# Patient Record
Sex: Female | Born: 1935 | Race: White | Hispanic: No
Health system: Southern US, Community
[De-identification: ages and names within clinical notes are randomized; demographics above are authoritative.]

---

## 2002-06-27 ENCOUNTER — Encounter: Payer: Self-pay | Admitting: Vascular Surgery

## 2002-06-29 ENCOUNTER — Ambulatory Visit (HOSPITAL_COMMUNITY): Admission: RE | Admit: 2002-06-29 | Discharge: 2002-06-29 | Payer: Self-pay | Admitting: Vascular Surgery

## 2002-07-09 ENCOUNTER — Encounter: Payer: Self-pay | Admitting: Vascular Surgery

## 2002-07-09 ENCOUNTER — Inpatient Hospital Stay (HOSPITAL_COMMUNITY): Admission: RE | Admit: 2002-07-09 | Discharge: 2002-07-17 | Payer: Self-pay | Admitting: Vascular Surgery

## 2002-07-17 ENCOUNTER — Inpatient Hospital Stay: Admission: RE | Admit: 2002-07-17 | Discharge: 2002-07-25 | Payer: Self-pay | Admitting: Vascular Surgery

## 2003-02-12 ENCOUNTER — Encounter: Payer: Self-pay | Admitting: Vascular Surgery

## 2003-02-14 ENCOUNTER — Ambulatory Visit (HOSPITAL_COMMUNITY): Admission: RE | Admit: 2003-02-14 | Discharge: 2003-02-15 | Payer: Self-pay | Admitting: Vascular Surgery

## 2005-03-15 ENCOUNTER — Encounter: Payer: Self-pay | Admitting: Internal Medicine

## 2006-04-24 ENCOUNTER — Inpatient Hospital Stay (HOSPITAL_COMMUNITY): Admission: EM | Admit: 2006-04-24 | Discharge: 2006-04-27 | Payer: Self-pay | Admitting: Emergency Medicine

## 2006-04-25 ENCOUNTER — Encounter (INDEPENDENT_AMBULATORY_CARE_PROVIDER_SITE_OTHER): Payer: Self-pay | Admitting: Specialist

## 2006-04-25 ENCOUNTER — Encounter (INDEPENDENT_AMBULATORY_CARE_PROVIDER_SITE_OTHER): Payer: Self-pay | Admitting: Interventional Cardiology

## 2007-10-18 IMAGING — CR DG CHEST 1V PORT
1 series · 1 of 1 positions shown · non-contrast
Comparison: None

CLINICAL DATA: [AGE], DVT left hand.  Preop respiratory exam.  
 PORTABLE CHEST - 04/24/2006:

[view not recorded]
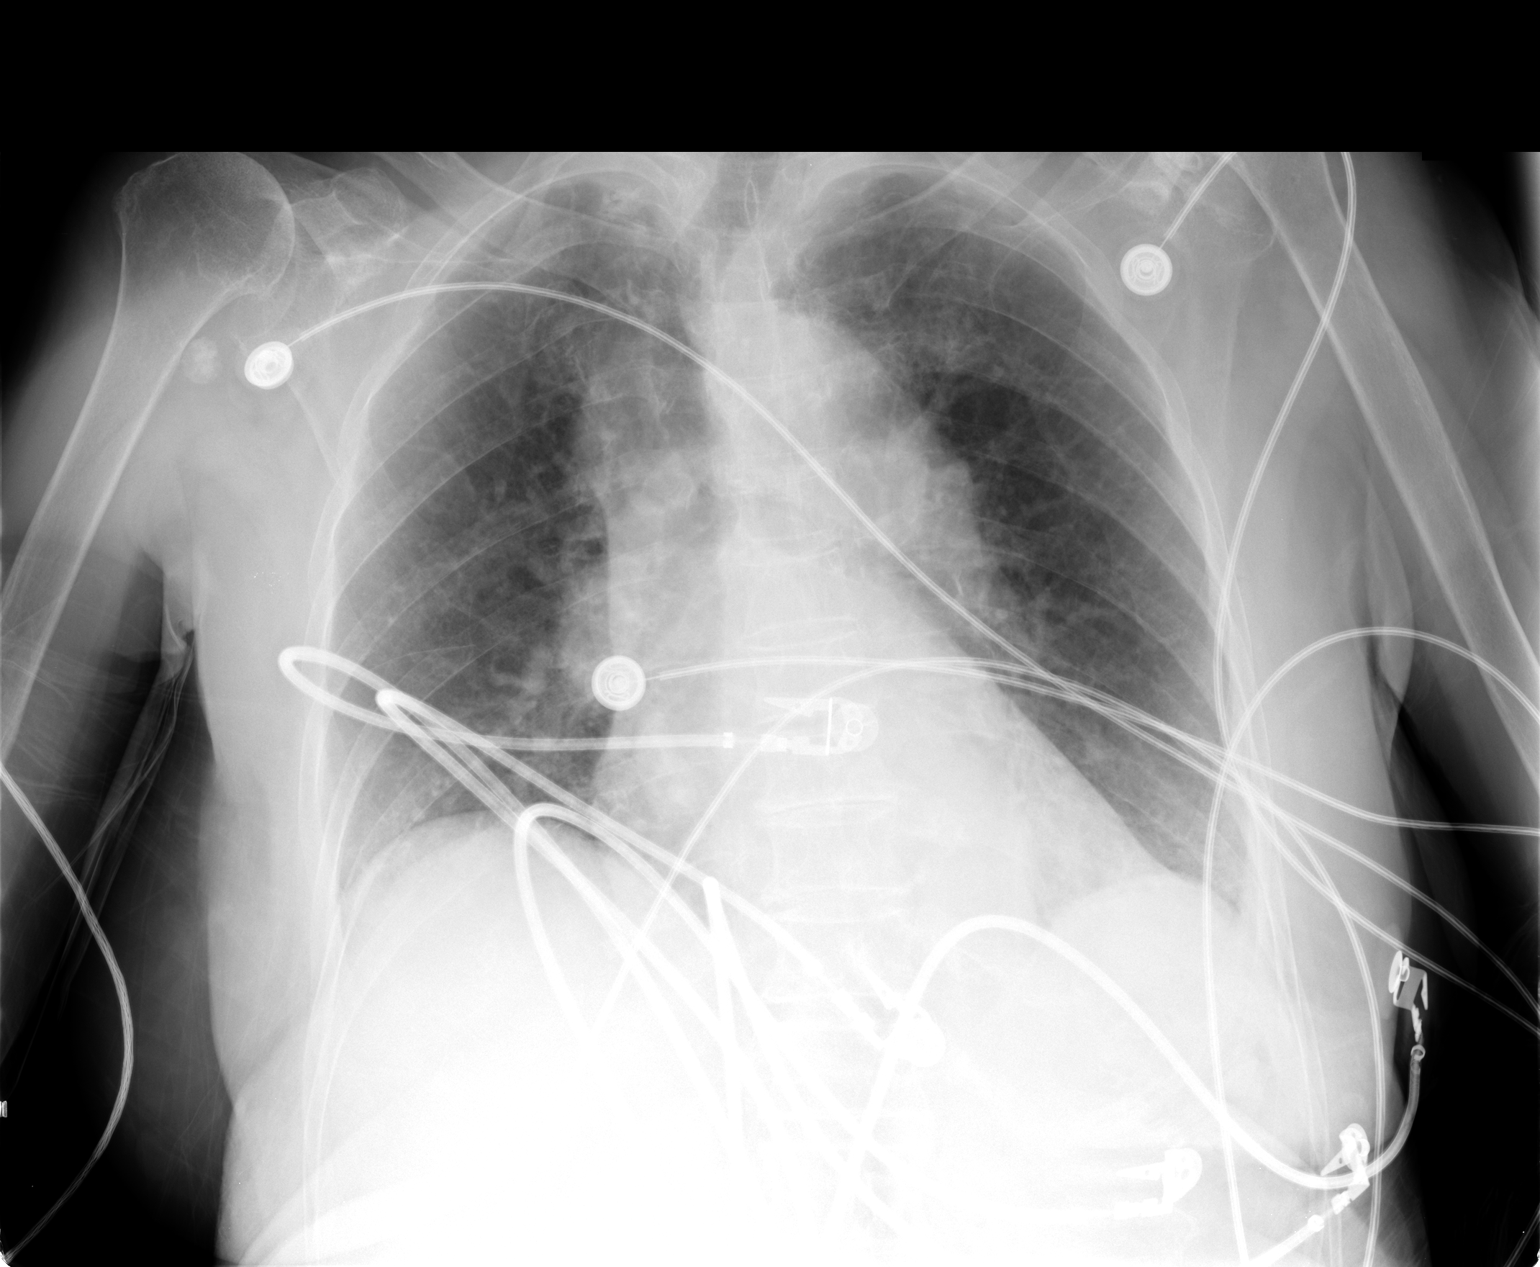

[1 of 1 positions shown; findings below may reference images not displayed]

FINDINGS: Heart is within normal limits in size.  There is tortuosity and ectasia of the thoracic aorta which is accentuated by rotation of the patient.   There is advanced degenerative change involving both shoulders.  No acute pulmonary findings.  Probable chronic interstitial changes.
IMPRESSION: No acute cardiopulmonary findings.

## 2009-11-14 DEATH — deceased

## 2010-10-02 NOTE — Op Note (Signed)
NAME:  [name], [name]                          ACCOUNT NO.:  [id number]   MEDICAL RECORD NO.:  [id number]                   PATIENT TYPE:  OIB   LOCATION:  2899                                 FACILITY:  [facility]   PHYSICIAN:  Quita Skye. Hart Rochester, M.D.               DATE OF BIRTH:  03-16-1910   DATE OF PROCEDURE:  06/29/2002  DATE OF DISCHARGE:                                 OPERATIVE REPORT   PREOPERATIVE DIAGNOSIS:  Ischemic ulcer right first toe secondary to femoral  popliteal and fibular occlusive disease.   POSTOPERATIVE DIAGNOSIS:  Ischemic ulcer right first toe secondary to  femoral popliteal and fibular occlusive disease.   PROCEDURE:  Abdominal aortogram with bilateral lower extremity runoff via  right common femoral approach.   SURGEON:  Quita Skye. Hart Rochester, M.D.   ANESTHESIA:  Local Xylocaine and Nubain 4 mg intravenously.   COMPLICATIONS:  None.   CONTRAST:  125 cc   DESCRIPTION OF PROCEDURE:  The patient was taken to the [facility]  Peripheral Endovascular Lab, placed in the supine position at which time  both groins were prepped with Betadine solution, and draped in routine  sterile manner.  After infiltration of 1% Xylocaine the right common femoral  artery was entered percutaneously, guide wire passed into the suprarenal  aorta under fluoroscopic guidance.  A #5 French sheath and dilator were  passed over the guide wire, dilator removed and standard pigtail catheter  positioned in the suprarenal aorta. Flush abdominal aortogram was performed  and injection of 20 cc of contrast at 20 cc/second.  This revealed the aorta  to be widely patent with single renal arteries bilaterally with mild  proximal stenosis on the left approximating 30 to 40%.  The superior  mesenteric artery filled rapidly.  Inferior mesenteric artery was patent.  Both common internal and external iliac arteries were widely patent.  The  catheter was withdrawn into the terminal aorta and bilateral  lower extremity  runoff performed injecting 88 cc of contrast at eight cc/second.  This  revealed the right superficial femoral artery to be patent but irregular in  the mid thigh and then totally occluded, reconstituting about eight to ten  cm above the knee joint.  It was taken across the knee joint and there was  severe tibial disease with no in continuity vessel patent.  There was  reconstitution  of a small diseased posterior tibial artery in the lower  third of the leg.  On the left side the superficial femoral artery was  widely patent and the popliteal artery was patent, tibial peroneal trunk  appeared significantly diseased but patent and the peroneal artery was  patent proximally but occluded distally.  Anterior tibial and posterior  tibial were occluded on the left.  Additional views of the right leg were  obtained using the peak hole technique both in the popliteal area and the  tibial vessels.  Following completion of this and removal of the pigtail  catheter the sheath was removed, adequate compression applied and no  complications ensued.   FINDINGS:  1. Widely patent aortoiliac system.  2. Mild proximal left renal artery stenosis.  3.     Right superficial femoral occlusion with reconstitution of the popliteal     artery above the knee and severe tibial disease.  4. Patent left superficial femoral and popliteal artery with severe tibial     disease.                                                 Quita Skye Hart Rochester, M.D.    JDL/MEDQ  D:  06/29/2002  T:  06/29/2002  Job:  [id number]

## 2010-10-02 NOTE — Discharge Summary (Signed)
NAME:  [name], [name]                          ACCOUNT NO.:  [id number]   MEDICAL RECORD NO.:  [id number]                   PATIENT TYPE:  INP   LOCATION:  2041                                 FACILITY:  [facility]   PHYSICIAN:  Quita Skye. Hart Rochester, M.D.               DATE OF BIRTH:  1909-06-06   DATE OF ADMISSION:  07/09/2002  DATE OF DISCHARGE:  07/17/2002                                 DISCHARGE SUMMARY   PRIMARY ADMITTING DIAGNOSIS:  Right great toe ulcer.   ADDITIONAL/DISCHARGE DIAGNOSES:  1. Peripheral vascular disease.  2. Right great toe ulcer.  3. Hiatal hernia/gastroesophageal reflux disease.  4. Questionable history of mild cerebrovascular disease in the past.  5. Postoperative deconditioning.  6. Mild right groin cellulitis.  7. Postoperative hypertension.   PROCEDURES PERFORMED:  1. Right femoral to below knee popliteal bypass with nonreverse saphenous     vein graft.  2. Intraoperative arteriogram.   HISTORY OF PRESENT ILLNESS:  The patient is a 77 year old white female who  has had nonhealing right great toe ulcer which has been present for 2-3  months.  It has increased in size and seems to be worsening despite her  efforts at home.  She has also complained of right foot pain and pain in her  calf when she ambulates as well as some rest pain and pain in her leg which  awakens her at night.  She was seen by her primary care physician who  ordered Doppler studies at [facility].  Her ankle branchial indices  were 0.46 on the right and 0.64 on the left.  She was referred to Dr. Jerilee Field for further evaluation.  He performed an arteriogram which showed  occlusion in the right superficial femoral artery with reconstitution of the  popliteal artery above the knee and severe tibial disease on the right.  It  was recommended that she proceed with surgical revascularization on the  right in an attempt to heal the ulcer on her right great toe.   HOSPITAL COURSE:   She was admitted on February 23 and taken to the operating  room, where she underwent a right femoral to below knee popliteal bypass  graft performed by Dr. Hart Rochester.  She tolerated the procedure well and was  transferred to the floor in stable condition.  Postoperatively, she was slow  to mobilize.  She worked with physical therapy, but due to her age and  deconditioning, was felt to need further rehab prior to discharge home.  She  also was somewhat nauseated in the first 48 hours postoperatively and her  diet was slowly advanced as this improved.  She developed some cellulitis in  the right groin area and was started on Keflex.  She also was quite  hypertensive throughout her admission with blood pressures running around  180 systolic to 200 systolic.  She was initially treated with IV labetalol  but was later started on Toprol XL 25 mg daily, which brought her pressures  down to the 110s to 120s systolic.  Otherwise her postoperative course was  uneventful.  Her surgical incision sites are healing well.  She remained  afebrile and all other vital signs were stable.  She had a 2+ palpable  popliteal pulse on the right with strong Doppler signals distally as well.  It was felt that in light of her need for further rehab and reconditioning  that she would benefit from a short stay in the subacute care unit and a bed  offer was extended on July 17, 2002.  The patient was transferred to the  [facility] in stable condition.   DISCHARGE MEDICATIONS:  1. Tylox 1-2 q.4h. p.r.n. for pain.  2. Protonix 40 mg p.o. daily.  3. Toprol XL 25 mg daily.  4. Keflex 500 mg q.8h.   DISCHARGE INSTRUCTIONS:   WOUND CARE:  Her incisions are to be cleaned daily with soap and water.   DIET:  She will continue her same preoperative diet.   FOLLOW UP:  She will continue to be followed by the CVTS service while on  the subacute care unit and discharge follow-up arrangements will be made  once she is able to go  home from the [place].        Coral Ceo, P.A.                        Quita Skye Hart Rochester, M.D.    GC/MEDQ  D:  09/15/2002  T:  09/15/2002  Job:  [id number]   cc:   Dr. Heloise Purpura, [place]

## 2010-10-02 NOTE — H&P (Signed)
[name], [name]                ACCOUNT NO.:  [id number]   MEDICAL RECORD NO.:  [id number]          PATIENT TYPE:  INP   LOCATION:  2016                         FACILITY:  [facility]   PHYSICIAN:  Di Kindle. Edilia Bo, M.D.DATE OF BIRTH:  1909/06/18   DATE OF ADMISSION:  04/24/2006  DATE OF DISCHARGE:                              HISTORY & PHYSICAL   REASON FOR ADMISSION:  Cold left hand.   History this is a pleasant 77 year old woman who had been in her normal  state of good health until approximately 8 a. M. this morning when she  noticed a fairly sudden onset of pain, paresthesias and coldness in her  left hand.  This persisted, and she went to [facility] and was  felt to have an ischemic left hand.  She was started on IV heparin and  transferred by ambulance to [facility].  She states that, during the  ride, her symptoms improved somewhat.  She had no previous left arm  symptoms.  She has had no history of atrial fibrillation of recent  myocardial infarction.  She has had a history of peripheral vascular  disease and has undergone a right fem-pop bypass grafting by Dr. Hart Rochester  she believes.   PAST MEDICAL HISTORY:  Is fairly unremarkable.  She does state that she  has mildly elevated cholesterol, though she is not on medication for  this.  She denies any history of diabetes, hypertension,  hypercholesterolemia, history of previous myocardial infarction, history  of congestive heart failure, history of arrhythmias, or history of COPD.   PAST SURGICAL HISTORY:  1. Right fem-pop bypass graft, possibly by Dr. Hart Rochester.  2. Hysterectomy.   MEDICATIONS:  1. Meclizine 25 mg p.o. 3 times a day.  2. Aspirin 81 mg p.o. daily.  3. Prilosec 20 mg p.o. daily.  4. Multivitamin one p.o. daily.   ALLERGIES:  PENICILLIN.   FAMILY HISTORY:  There is no history of premature cardiovascular  disease. Mother died from heart attack at age 77.   SOCIAL HISTORY:  She is widowed.  She  has two sons; one lives at  [place], [place], the other in [place].  She does not use  tobacco or alcohol.   REVIEW OF SYSTEMS:  GENERAL:  She had no weight loss, weight gain  problems with her appetite.  CARDIAC:  She had no chest pain, chest  pressure, palpitations, arrhythmias. PULMONARY:  She has had no  bronchitis, asthma, or wheezing. GI: She had no recent change of bowel  habits, has no history of peptic ulcer disease.  GU: She has some  urinary frequency. VASCULAR:  She had no significant claudication, rest  pain or nonhealing ulcers.  No history of DVT or phlebitis.  NEUROLOGIC:  She states that she had three minor strokes of these symptoms. She  describes these are somewhat unusual. She is describes nausea and  vomiting for one, weakness and problems with balance for another, and a  visual problem with the other, although did not sound like amaurosis  fugax.  She denies any history of TIAs or amaurosis fugax.  HEMATOLOGY:  She has had no bleeding problems or clotting disorders.   PHYSICAL EXAMINATION:  VITAL SIGNS:  Temperature 97.7,  blood pressure  220/99, heart rate 84, saturation 100%.  HEENT/NECK: I do not detect any carotid bruits.  There is no cervical  lymphadenopathy.  LUNGS:  Clear bilaterally to auscultation.  CARDIAC EXAM: She has a systolic ejection murmur.  ABDOMEN: Soft and nontender.  I cannot palpate an aneurysm.  She has  palpable femoral and popliteal pulses.  I cannot palpate pedal pulses.  She does have a monophasic right posterior tibial signal and a  monophasic left anterior tibial and posterior tibial signal.  EXTREMITIES:  Upper extremity vascular exam reveals palpable brachial  and radial pulses on the right with an axillary pulse on the left but no  brachial or radial pulse.  She does have a markedly dampened monophasic  radial and ulnar signal with the Doppler.  The left hand is slightly  mottled and cool.  Motor and sensory function is  intact.   IMPRESSION:  This patient presents with an acute arterial occlusion of  the left upper extremity, most likely at brachial embolus.  The other  potential possibility would be a the proximal subclavian stenosis or  occlusion; however, she does have an axillary pulse.   PLAN:  Urgent brachial embolectomy.  I have discussed the indications  for surgery and the potential complications with the family.  We will  proceed urgently. She last ate at 9:00 a.m.      Di Kindle. Edilia Bo, M.D.  Electronically Signed     CSD/MEDQ  D:  04/24/2006  T:  04/24/2006  Job:  [id number]

## 2010-10-02 NOTE — Discharge Summary (Signed)
NAME:  [name], WOJTAS                          ACCOUNT NO.:  [id number]   MEDICAL RECORD NO.:  [id number]                   PATIENT TYPE:  INP   LOCATION:  SACU                                 FACILITY:  [facility]   PHYSICIAN:  Quita Skye. Hart Rochester, M.D.               DATE OF BIRTH:  [birth year]   DATE OF ADMISSION:  07/17/2002  DATE OF DISCHARGE:  07/25/2002                                 DISCHARGE SUMMARY   PRIMARY CARE PHYSICIAN:  Ashish C. Sherryll Burger, M.D.   FINAL DIAGNOSES:  1. Nonhealing right great toe wound.  2. Severe peripheral vascular disease status, post right femoral to below     the knee popliteal bypass graft prior to transferring the Subacute Care     Unit.  3. Deconditioning, using walker.  4. Gastroesophageal reflux disease.  5. Remote mild cerebrovascular accident with no residual deficit.  6. Mild malnutrition.  7. Hypertension.   DISCHARGE MEDICATIONS:  1. Toprol XL 25 mg one daily.  2. Protonix 40 mg one daily.  3. Tylenol 650 mg p.o. q.4 h. p.r.n. for pain.  4. Multivitamin daily.  5. Ensure drink supplement, one can daily.  6. She just finished a ten day course of prophylactic Keflex for her right     groin wound.   ALLERGIES:  No known drug allergies.   BRIEF HISTORY:  The patient is a 77 year-old white female who had a  nonhealing right great toe wound and severe peripheral vascular disease.  She underwent a right SFA to below the knee popliteal bypass graft with  greater saphenous vein on July 09, 2002.  There were no major  complications and postoperatively she did fairly well.  She was very  deconditioned and was transferred to the [facility] unit on July 17, 2002.   Since transfer to [facility], she has made good progress working with physical  therapy using a walker.  Her bypass graft continues to be patent with a  palpable popliteal pulse. Her right first toe is stable.  It has a dry  necrotic area which has not gotten any worse and has been treated by  simply  putting a loose gauze wrapping around it.  Before transfer to [facility], her  right groin wound was a little worrisome for cellulitis.  She was started on  prophylactic Keflex.  Her wound has improved and all wounds are doing well  at this time.   She did have some problem with diarrhea on SACU.  This probably was  secondary to a combination of being on laxatives and stool softeners and  getting the Keflex.  The Keflex will be ten days after today.  Her stool  softeners and laxatives are discontinued.  She has had no diarrhea today.   On July 24, 2002, she is doing well, afebrile and vital signs are stable.  The right toe wound is stable.  She has a 3+  popliteal pulse.  Her right  lower extremity is warm and pink and well perfused.  She is making fair  progress with rehab and using the walker.  She should be suitable for  discharge back to the [facility] tomorrow, July 25, 2002.   CONDITION ON DISCHARGE:  Improved.   PROCEDURES:  None during SACU.   DISPOSITION:  Radiation protection practitioner.   SPECIAL INSTRUCTIONS:  1. Her wounds need to be cleaned daily gently with soap and water especially     her groin wound after which it should be dried thoroughly and covered     with dry gauze.  The right great toe should be gently cleansed with soap     and water, dried thoroughly and wrapped with a small gauze dressing     daily.  2. She is to told to continue working with physical therapy and to work on     her walking.  3. Of course, she is not to engage in any strenuous activity with heavy     lifting or driving a vehicle.  4. She may shower with supervision and a shower chair.   FOLLOWUP:  1. Follow up with Dr. Hart Rochester on Tuesday, August 14, 2002, at 9 a.m.  2. She should contact Dr. Sherryll Burger to be evaluated mainly concerning her     hypertension for which she was started on a new medication, Toprol XL,     during this stay.  3. Home health physical therapy and  occupational therapy will see her.     Jody P. Melvyn Neth, P.A.-C                     Quita Skye Hart Rochester, M.D.    JPL/MEDQ  D:  07/24/2002  T:  07/24/2002  Job:  [id number]   cc:   Beatrix Fetters C. Sherryll Burger, M.D.

## 2010-10-02 NOTE — Consult Note (Signed)
NAMEBRIANCA, [name]                ACCOUNT NO.:  [id number]   MEDICAL RECORD NO.:  [id number]          PATIENT TYPE:  OUT   LOCATION:  XRAY                         FACILITY:  [facility]   PHYSICIAN:  Sanjeev K. Deveshwar, M.D.DATE OF BIRTH:  [birth year]   DATE OF CONSULTATION:  03/15/2005  DATE OF DISCHARGE:                                   CONSULTATION   CHIEF COMPLAINT:  Recent TIAs/CVAs.   HISTORY OF PRESENT ILLNESS:  This is a very pleasant, alert, 77 year old  left-handed female who appears younger than her stated age with a history of  cerebrovascular disease consisting of TIAs and two known recent CVAs. The  patient apparently had a stroke in March 2006 with another more recent  stroke several weeks ago. She had an MRI/MRA of the carotids performed  February 17, 2005, that showed mild bilateral stenosis. She had an MRI/MRA of  the head on February 17, 2005, that showed progression of disease from a  previous MRI performed in March 2006. The left vertebral artery was  extremely stenosed. The A1 segment of the left anterior cerebral was absent.  There was a question of absent posterior communicating arteries bilaterally.  There was also disease of the bilateral inferior cerebellar arteries.   Since the patient's recent CVA, she has had symptoms of intermittent  dizziness as well as a pressure sensation in her head. She notes some mild  problems with coordination, especially with her left hand, and as noted she  is left-handed. The patient has been started on Aggrenox; however, she  continuous to have symptoms. She has been referred to Dr. Corliss Skains for  further evaluation and possible treatment.   PAST MEDICAL HISTORY:  Significant for the above-noted CVAs and probable  TIAs. She has a history of hypertension, remote peptic ulcer disease. She  has peripheral vascular disease. She underwent a PTA of the right  superficial femoral artery performed February 14, 2003, by Dr. Hart Rochester. She  was noted to have mild bilateral renal artery stenosis of approximately 30-  40%. She has a history of a hiatal hernia, gastroesophageal reflux disease,  some arthritis.   The patient reports that she was recently started on lisinopril for her  hypertension. She took one dose and became very dizzy and confused. She  subsequently stopped taking the medication.   SOCIAL HISTORY:  Significant for a previous hysterectomy.   ALLERGIES:  The patient states that she is allergic to SURGICAL TAPE,  although she tolerates paper tape. She is also allergic to CHEESE.   CURRENT MEDICATIONS:  1.  Lisinopril 10 mg daily. As noted, she took one dose and stopped this      medication due to becoming severely symptomatic.  2.  Aggrenox 200/25 one b.i.d.  3.  Allegra 180 mg daily.  4.  Meclizine 12.5 mg t.i.d.  5.  Prilosec over-the-counter.   SOCIAL HISTORY:  The patient is widowed, she has two children. She lives in  a retirement home in [place]. She has been very independent. She does not use  alcohol or tobacco; she never has. She is a retired  nurse.   FAMILY HISTORY:  Her mother died at age 68 from congestive heart failure.  Her father died at age 51 from a CVA.   IMPRESSION AND PLAN:  Dr. Corliss Skains met with the patient, her son, and  daughter-in-law today. He reviewed the films from her recent MRI/MRA and  discussed these with the patient and her family. He was very concerned that  the patient appears to have occluded her left vertebral artery and the right  vertebral artery is severely stenotic. He also noted some stenosis in the  right middle cerebral artery. He felt that these findings correlated with  the patient's recent intermittent symptoms. He also felt that the patient  would need to be mildly hypertensive with a systolic blood pressure in the  140-150 range in order to maintain perfusion. He agreed with discontinuing  the lisinopril.   Dr. Corliss Skains did feel that she might possibly be  a candidate for PTA  stenting of the right vertebral artery. However, there was concern about her  advanced age. She is in overall very good health and has been very  independent despite her current age of 77. The risks and benefits of  intervening were discussed in detail. Dr. Corliss Skains recommended that the  patient and her family go home and think about whether or not they want to  proceed. He told them to take as much time as they needed and to call him if  they had any questions. Dr. Corliss Skains will be in contact with Dr. Sherryll Burger to  discuss his impressions.   Greater than 40 minutes were spent on this consult today.      Delton See, P.A.    ______________________________  Grandville Silos. Corliss Skains, M.D.    DR/MEDQ  D:  03/15/2005  T:  03/15/2005  Job:  [id number]   cc:   Kirstie Peri, MD  Fax: [phone]

## 2010-10-02 NOTE — Op Note (Signed)
NAME:  [name], [name]                          ACCOUNT NO.:  [id number]   MEDICAL RECORD NO.:  [id number]                   PATIENT TYPE:  OIB   LOCATION:  2899                                 FACILITY:  [facility]   PHYSICIAN:  Quita Skye. Hart Rochester, M.D.               DATE OF BIRTH:  22-Jun-1909   DATE OF PROCEDURE:  02/14/2003  DATE OF DISCHARGE:                                 OPERATIVE REPORT   PREOPERATIVE DIAGNOSIS:  Right superficial femoral artery occlusive disease  proximal to superficial femoral to popliteal bypass graft.   POSTOPERATIVE DIAGNOSIS:  Right superficial femoral artery occlusive disease  proximal to superficial femoral to popliteal bypass graft.   OPERATION PERFORMED:  1. Abdominal aortogram with bilateral lower extremity runoff via left common     femoral approach.  2. Antegrade puncture of right common femoral artery with primary     percutaneous transluminal angioplasty of right superficial femoral artery     using a 4 x 2 Slalom percutaneous transluminal angioplasty catheter with     completion  angiography.   SURGEON:  Quita Skye. Hart Rochester, M.D.   ANESTHESIA:  Local Xylocaine.  Heparin 3000 units.   COMPLICATIONS:  None.   DESCRIPTION OF PROCEDURE:  The patient was taken to [facility] peripheral  endovascular lab and placed in supine position at which time both groins  were prepped with Betadine solution and draped in routine sterile manner.  After infiltration with 1% Xylocaine, the left common femoral artery was  entered percutaneously, guidewire passed into the suprarenal aorta under  fluoroscopic guidance.  A 5 French sheath and dilator were passed over the  guidewire, dilator removed.  A standard pigtail catheter positioned in the  suprarenal aorta.  A flush abdominal aortogram was performed injecting 20mL  of contrast at 20mL per second.  This revealed the aorta to be widely patent  with single renal arteries bilaterally with mild proximal stenoses  approximating 30 to 40%.  Inferior mesenteric artery was patent.  Both  common internal and external iliac arteries were widely patent.  Catheter  was withdrawn into the terminal aorta and bilateral lower extremity runoff  performed injected 88mL of contrast at 8mL per second.  This revealed the  left leg to have a widely patent superficial femoral artery with mild  disease.  There was severe tibial disease distal to the popliteal artery  with one vessel runoff through a very diseased peroneal artery on the left.  The right side had a widely patent iliac system and common femoral system.  The superficial femoral artery was widely patent for about 8 to 10 cm but  there was a focal 95% stenosis about 3 to 4 cm proximal to a right  superficial femoral to popliteal (below knee) bypass.  Distally on the right  there was severe tibial disease with one vessel runoff through a diseased  peroneal artery.  It was decided  to treat this SFA lesion with primary  angioplasty and therefore the right common femoral artery was punctured in  antegrade fashion and guidewire passed distally down this superficial  femoral artery and a 5 French sheath and dilator inserted, dilator removed.  3000 units heparin given intravenously.  Using a 0.014 guidewire a 4 x 2  Slalom PTA catheter was positioned after localizing the lesion using the  __________ marker.  The lesion was quite focal and severely stenotic.  One  inflation at 12 atmospheres for 45 seconds was performed and at completion,  angiogram revealed some mild persistent narrowing approximating 20%.  A  second inflation was done at 14 atmospheres for 45 seconds and a second  angiogram revealed a significant improvement from the preoperative state  with a very mild indentation in the vessel.  Following this, the guidewires  were removed, adequate compression applied after the heparin was reversed.  The sheaths removed and no complications ensued.    FINDINGS:  1. Mild ostial renal artery stenoses bilaterally approximating 30 to 40%.  2. Widely patent aortoiliac system.  3. 95% focal stenosis, right superficial femoral artery proximal to the     superficial femoral to popliteal bypass.  4. Severe bilateral tibial disease.  5. Successful percutaneous transluminal angioplasty of the right superficial     femoral artery with a 4 x 2 Slalom percutaneous transluminal angioplasty     catheter with two inflations at 12 atmospheres for 45 seconds and 14     atmospheres for 45 seconds.                                               Quita Skye Hart Rochester, M.D.    JDL/MEDQ  D:  02/14/2003  T:  02/14/2003  Job:  [id number]

## 2010-10-02 NOTE — Discharge Summary (Signed)
[name], [name]                ACCOUNT NO.:  [id number]   MEDICAL RECORD NO.:  [id number]           PATIENT TYPE:   LOCATION:                                 FACILITY:   PHYSICIAN:  [name]. Edilia Bo, M.D.DATE OF BIRTH:  10-27-1909   DATE OF ADMISSION:  04/24/2006  DATE OF DISCHARGE:                               DISCHARGE SUMMARY   ADMISSION DIAGNOSIS:  Acute arterial occlusion of the left upper  extremity.   PAST MEDICAL HISTORY AND DISCHARGE DIAGNOSES:  1. Peripheral vascular occlusive disease, status post right femoral to      popliteal bypass graft.  2. Hysterectomy.  3. Acute arterial occlusion of the left upper extremity, status post      left brachial embolectomy, left axillary artery thrombo-      endarterectomy and vein patch angioplasty of left brachial and      axillary arteries.   ALLERGIES:  PENICILLIN.   BRIEF HISTORY:  The patient is a 77 year old female who developed sudden  onset of pain, coldness and paresthesias in the left hand approximately  8:00 a.m. on December9,2007.  The patient denied any history of atrial  fibrillation or recent myocardial infarction.  She presented to [facility], and it was determined that she would need a vascular  evaluation.  Therefore, she was transferred to [facility], at  which time Dr. Edilia Bo evaluated the patient.  The patient stated that  her pain improved somewhat during the ambulance ride from [facility] to [facility].  On exam, there were absent brachial and radial  pulses on the left; however, there was an axillary pulse on the left.  Her left hand was slightly mottled and cool.  It was Dr. Adele Dan  opinion that the patient had an acute  arterial occlusion of the left upper extremity and would require  emergent revascularization.   HOSPITAL COURSE:  The patient was admitted with an acute arterial  occlusion as previously stated on April 24, 2006 by Dr. Durwin Nora.  The  patient was  then taken emergently to the operating room for a left  brachial embolectomy, left axillary artery thrombo-endarterectomy, and  vein patch angioplasties of both the left brachial and axillary artery.  Patient tolerated the procedure well, was hemodynamically stable  immediately postoperatively.  The patient transferred from OR to post  anesthesia care unit in stable condition. The patient was extubated  without complication and woke up from anesthesia neurologically intact.   On post-op day #1, the patient was comfortable.  She is afebrile with  stable vital signs.  Her incisions are healing well, and there is a  palpable radial pulse.  She is willing to have a 2D echocardiogram  performed today to rule out a cardiac source of her embolus.  She was  initially maintained on IV Heparin.  This was switched to Lovenox, and  she will likely not need Coumadin, unless echocardiogram revealed a  cardiac source of the embolus.  The patient is in stable condition at  this time, and as long as she continues to preogress in the current  manner, she will be ready for discharge home with the next 1-2 days,  pending morning round re-evaluation.   LABS:  CBC on December9, 2007:  Hemoglobin 11.4, hematocrit 34.1, white  count 6.6, platelets 179.  BNP on April 24, 2006:  Sodium 142,  potassium 3.8, BUN 15, creatinine 0.9, glucose 94.   CONDITION ON DISCHARGE:  Improved.   INSTRUCTIONS:  The patient received specific written instructions  regarding diet, activity and wound care.   MEDICATIONS:  1. Prilosec 20 mg b.i.d.  2. Lisinopril 10 mg daily.  3. Allegra 180 mg p.o. p.r.n.  4. Meclizine 25 mg p.o. t.i.d.  5. Aspirin 81 mg daily.  6. Multivitamin daily.   Follow-up appointment with Dr. Edilia Bo 3 weeks after discharge.  CVTS  office with contact the patient with the date and time of this  appointment.      [name] Leisure, PA      [name]. Edilia Bo, M.D.  Electronically  Signed    AY/MEDQ  D:  04/25/2006  T:  04/25/2006  Job:  [id number]

## 2010-10-02 NOTE — Op Note (Signed)
[name], [name]                ACCOUNT NO.:  [id number]   MEDICAL RECORD NO.:  [id number]          PATIENT TYPE:  INP   LOCATION:  1827                         FACILITY:  [facility]   PHYSICIAN:  Di Kindle. Edilia Bo, M.D.DATE OF BIRTH:  03/27/1910   DATE OF PROCEDURE:  04/24/2006  DATE OF DISCHARGE:                               OPERATIVE REPORT   PREOPERATIVE DIAGNOSIS:  Acute arterial occlusion of the left upper  extremity.   POSTOPERATIVE DIAGNOSIS:  Acute arterial occlusion of the left upper  extremity.   PROCEDURE:  1. Left brachial embolectomy with vein patch angioplasty of the left      brachial artery.  2. Left axillary artery thromboendarterectomy with vein patch      angioplasty of left axillary artery.   SURGEON:  Dr. Edilia Bo.   ASSISTANT:  Nurse.   ANESTHESIA:  General.   INDICATIONS:  This is a 77 year old woman who developed sudden onset of  pain, paresthesias and coolness of the left forearm and hand.  On exam  she had an absent brachial and radial pulse with a palpable axillary  pulse.  The patient was taken urgently to the operating room for  attempted revascularization.   TECHNIQUE:  The patient was taken to the operating room and received a  general anesthetic.  Left upper extremity was prepped and draped in  usual sterile fashion.  A longitudinal incision was made over the  brachial artery just below the antecubital level.  Here the brachial  artery is dissected free and had no pulse.  The patient was heparinized  with 6000 units of IV heparin.  The artery was clamped and the radial  and ulnar branches controlled with vessel loops.  A longitudinal  arteriotomy was made and a 3 Fogarty catheter was passed down the radial  and ulnar branch with no significant clot retrieved.  The catheter was  proximally and no real clot was retrieved.  However, there was some  difficulty in passing it through the axillary artery.  No clot was  retrieved from this  approach.  A segment of the cephalic vein was  divided between silk ties and opened longitudinally and vein patch was  then sewn using continuous 6-0 Prolene suture to close the longitudinal  arteriotomy.  Next a longitudinal incision was made in the axilla and  the axillary artery was dissected free.  There was a good pulse  proximally and then area of stenosis was identified and there was no  pulse beyond this.  The artery was clamped proximally distally and this  artery was opened longitudinally. There was clot present within the  plaque here.  This was removed and the endarterectomy was performed with  two tacking sutures distally.  One of the brachial veins was harvested  opened longitudinally and sewn as a vein patch using continuous 6-0  Prolene suture.  At completion there was an excellent radial pulse.  Hemostasis was obtained in the wounds and heparin was partially reversed  with protamine.  The wounds were  closed with deep layer of 3-0 Vicryl.  The skin closed with 4-0  Vicryl.  Sterile dressing was applied.  The patient tolerated the procedure well  and was transferred to recovery room in satisfactory condition.  All  needle and sponge counts were correct.      Di Kindle. Edilia Bo, M.D.  Electronically Signed     CSD/MEDQ  D:  04/24/2006  T:  04/25/2006  Job:  [id number]

## 2010-10-02 NOTE — H&P (Signed)
NAME:  [name], [name]                          ACCOUNT NO.:  [id number]   MEDICAL RECORD NO.:  [id number]                   PATIENT TYPE:  INP   LOCATION:  NA                                   FACILITY:  [facility]   PHYSICIAN:  Quita Skye. Hart Rochester, M.D.               DATE OF BIRTH:  [birth year]   DATE OF ADMISSION:  07/09/2002  DATE OF DISCHARGE:                                HISTORY & PHYSICAL   CHIEF COMPLAINT:  Right great toe ulcer.   HISTORY OF PRESENT ILLNESS:  The patient is a 77 year old white female who  over the past two to three months has had a nonhealing right great toe  ulcer.  This started out as a calloused area and recently the patient had a  pedicure at which time they shaved down the callus.  Shortly thereafter it  became ulcerated and since that time it has increased in size.  It has not  shown any signs of healing; in fact, seems to be worsening.  She has had  persistent pain in her right great toe and medial right foot.  She has also  noted numbness and discomfort in her right calf as well.  She has some  discomfort when she ambulated but also has noted some rest pain and some  pain in her leg which awakens her at night.  She was seen by her primary  care physician who ordered Doppler studies which were performed at [facility].  Her ankle-brachial indices were 0.46 on the right and 0.64 on the  left.  She was referred to Dr. Jerilee Field for further evaluation.  An  arteriogram was performed which showed occlusion in the right superficial  femoral artery with reconstitution of the popliteal artery above the knee  and severe tibial disease on the right, as well as a patent left SFA and  popliteal artery with severe tibial disease on the left.  It was recommended  that she proceed with surgical revascularization on the right in attempt to  heal the ulcer on her right great toe.   PAST MEDICAL HISTORY:  1. Hiatal hernia/gastroesophageal reflux disease.  2.  Questionable mild CVA around four to five years ago with no residual     deficits.   Specifically, the patient denies coronary artery disease, COPD, cancer,  hypertension, diabetes mellitus.   PAST SURGICAL HISTORY:  Hysterectomy around 50 years ago.   CURRENT MEDICATIONS:  1. Prilosec unknown dosage daily.  2. Ultracet one q.4-6h. p.r.n. for pain.  3. Caltrate one daily.  4. Multivitamin daily.   ALLERGIES:  No known drug allergies, although the patient reports a  significant allergy to CHEESE and to SURGICAL TAPE.  She can use paper tape  without complications.   FAMILY HISTORY:  Her mother died at age 67 of congestive heart failure  complications.  Her father died at age 76 of a  CVA.  She had one sister who  died at age 74 of cancer, one sister who died at age 47 of a CVA, one  brother who died at age 75 of cancer, one brother who died at age 73 of a  myocardial infarction, one brother who died at age 59 of a myocardial  infarction who also had cancer, and one brother who died at age 62 of a  myocardial infarction.   SOCIAL HISTORY:  She is widowed and has two children.  She resides in a  retirement home in [place].  She denies any prior or current alcohol or tobacco  use.  She was previously employed as a Arts administrator.   REVIEW OF SYMPTOMS:  See history of present illness for pertinent positives  and negatives.  Also, she reports generalized pruritus which comes and goes  and for which she takes Benadryl over-the-counter.  She has some arthritis  which specifically bothers her right shoulder.  She notes occasional lower  extremity edema bilaterally.  She also has periodic nocturia and notes that  she bruises easily.  She denies fevers, chills, weight loss, recent  infections, TIA symptoms, dysphagia, visual changes, chest pain,  palpitations, shortness of breath, dyspnea on exertion, orthopnea, cough,  abdominal pain, nausea, vomiting, diarrhea, constipation,  dysuria,  hematuria, hematochezia, melena, depression, anxiety, intolerance to heat or  cold.   PHYSICAL EXAMINATION:  VITAL SIGNS:  Blood pressure 138/86, heart rate 80  and regular, respirations 18 and unlabored.  GENERAL:  This is an elderly white female who appears somewhat younger than  her stated age of 77.  She is in no acute distress.  HEENT:  Normocephalic, atraumatic.  Pupils equal, round, and react to light  and accomodation.  Extraocular movements intact.  TMs and canals are clear  bilaterally.  Nares patent bilaterally.  Oropharynx is clear with upper  dentures in place.  She has no lower teeth.  NECK:  Supple without lymphadenopathy, thyromegaly, or carotid bruits.  HEART:  Regular rate and rhythm without murmurs, rubs, or gallops.  LUNGS:  Clear to auscultation.  ABDOMEN:  Soft, nontender, nondistended, with active bowel sounds in all  quadrants.  No masses or hepatosplenomegaly.  EXTREMITIES:  No clubbing, cyanosis, or edema.  There is a right medial  great toe ulcer which is somewhat shallow.  There is no erythema or  drainage.  The underlying tissue is clean with no necrotic material.  The  area is quite tender to touch.  There are no other ischemic changes noted.  She has 2+ bilateral femoral pulses with dopplerable posterior tibial and  dorsalis pedis pulses on the left. There is a dopplerable posterior pulse on  the right.  She has a large area of ecchymosis from the right groin to mid  thigh which is soft and no specific hematoma is noted.  NEUROLOGIC:  Cranial nerves II-XII grossly intact.  She is alert and  oriented x3.  Her gait is within normal limits for her age.   ASSESSMENT AND PLAN:  This is a 77 year old white female with a nonhealing  right great toe ulcer and significant peripheral vascular disease.  She will  be admitted to [facility] on July 09, 2002 and undergo right femoral to popliteal bypass graft by Dr. Jerilee Field.     Coral Ceo, P.A.                        Fayrene Fearing  Marlynn Perking, M.D.    GC/MEDQ  D:  07/05/2002  T:  07/05/2002  Job:  [id number]   cc:   Kirstie Peri  [address]  [place] [address]  Fax: [phone]

## 2010-10-02 NOTE — Op Note (Signed)
NAME:  [name], [name]                          ACCOUNT NO.:  [id number]   MEDICAL RECORD NO.:  [id number]                   PATIENT TYPE:  INP   LOCATION:  2899                                 FACILITY:  [facility]   PHYSICIAN:  Quita Skye. Hart Rochester, M.D.               DATE OF BIRTH:  June 20, 1909   DATE OF PROCEDURE:  DATE OF DISCHARGE:                                 OPERATIVE REPORT   PREOPERATIVE DIAGNOSES:  Ischemic ulcer, right first toe, secondary to  severe superficial femoral, popliteal, and tibial occlusive disease.   POSTOPERATIVE DIAGNOSES:  Ischemic ulcer, right first toe, secondary to  severe superficial femoral, popliteal, and tibial occlusive disease.   OPERATION:  Right superficial femoral to popliteal (below knee) bypass using  a non-reverse translocated saphenous vein graft from the right leg with  intraoperative arteriogram.   SURGEON:  Josephina Gip, M.D.   FIRST ASSISTANT:  Gretta Began, M.D.   SECOND ASSISTANT:  Sherrie George, M.D.   ANESTHESIA:  General endotracheal.   PROCEDURE:  The patient was taken to the OR and placed in the supine  position at which time satisfactory general endotracheal anesthesia was  administered. Right leg was prepped with Betadine scrub and solution and  draped in routine sterile manner. Saphenous vein was exposed at the  saphenous femoral junction and dissected down to the mid calf level. It's  branches were ligated with 4-0 and 5-0 silk ties and divided. It was removed  and gently dilated with Heparinized saline and marked for orientation  purposes. The vein was fairly good down to the distal thigh level, where it  became smaller. Therefore, limited amount was available. The superficial  femoral artery was patent down to the mid thigh level and it was exposed  about 8-10 cm distal to it's origin, where it was diseased but had an  excellent pulse. The popliteal artery was then exposed below the knee where  it was a soft vessel but no  pulse was palpable. Tunnel was then created in  the anatomic position and the patient was heparinized. Superficial femoral  artery was then occluded proximally and distally, opened with a #15 blade,  extended with the Potts scissors. The proximal end of the vein was slightly  spatulated and anastomosed end-to-side with 6-0 Prolene. The clamp was then  released and there was an excellent pulse down to the first set of confident  valves. Using a retrograde valvulotome, the valves were rendered incompetent  with result in excellent flow out the distal end of the vein graft. The vein  was carefully delivered through the tunnel and then anastomosed end-to-side  to the below the knee popliteal artery with 6-0 Prolene. The vessel was  mildly diseased proximally but was relatively normal distally and widely  patent. Following completion of this, the clamp was released and there was  an excellent pulse in the vein graft with good Doppler flow  in the posterior  tibial artery at the ankle and also flow in the anterior tibial and perineal  arteries which was not quite as good. Intraoperative arteriogram revealed  widely patent anastomosis to the popliteal artery with severe tibial disease  with only patent vessel being the posterior tibial and it being severely  diseased all the way to  the ankle level. Protamine was given to reverse the Heparin. Following  adequate hemostasis, the wound was irrigated with saline and closed in  layers using Vicryl in a subcuticular fashion. Sterile dressing was applied  and the patient was taken to the recovery room  in satisfactory condition.                                               Quita Skye Hart Rochester, M.D.    JDL/MEDQ  D:  07/09/2002  T:  07/09/2002  Job:  [id number]

## 2015-05-14 ENCOUNTER — Telehealth: Payer: Self-pay

## 2015-05-16 NOTE — Telephone Encounter (Signed)
Error
